# Patient Record
Sex: Female | Born: 1970
Health system: Southern US, Community
[De-identification: ages and names within clinical notes are randomized; demographics above are authoritative.]

## PROBLEM LIST (undated history)

## (undated) DIAGNOSIS — I491 Atrial premature depolarization: Secondary | ICD-10-CM

## (undated) DIAGNOSIS — F419 Anxiety disorder, unspecified: Secondary | ICD-10-CM

## (undated) HISTORY — DX: Anxiety disorder, unspecified: F41.9

## (undated) HISTORY — DX: Atrial premature depolarization: I49.1

## (undated) HISTORY — PX: OTHER SURGICAL HISTORY: SHX169

---

## 1999-05-22 ENCOUNTER — Observation Stay (HOSPITAL_COMMUNITY): Admission: AD | Admit: 1999-05-22 | Discharge: 1999-05-23 | Payer: Self-pay | Admitting: Obstetrics and Gynecology

## 1999-05-24 ENCOUNTER — Encounter: Payer: Self-pay | Admitting: Obstetrics and Gynecology

## 1999-05-24 ENCOUNTER — Inpatient Hospital Stay (HOSPITAL_COMMUNITY): Admission: RE | Admit: 1999-05-24 | Discharge: 1999-05-28 | Payer: Self-pay | Admitting: Obstetrics and Gynecology

## 1999-05-25 ENCOUNTER — Encounter: Payer: Self-pay | Admitting: Obstetrics and Gynecology

## 1999-05-29 ENCOUNTER — Encounter (HOSPITAL_COMMUNITY): Admission: RE | Admit: 1999-05-29 | Discharge: 1999-08-27 | Payer: Self-pay | Admitting: Obstetrics and Gynecology

## 1999-09-02 ENCOUNTER — Encounter (HOSPITAL_COMMUNITY): Admission: RE | Admit: 1999-09-02 | Discharge: 1999-12-01 | Payer: Self-pay | Admitting: Obstetrics and Gynecology

## 1999-12-04 ENCOUNTER — Encounter (HOSPITAL_COMMUNITY): Admission: RE | Admit: 1999-12-04 | Discharge: 2000-03-03 | Payer: Self-pay | Admitting: Obstetrics and Gynecology

## 2000-04-03 ENCOUNTER — Encounter (HOSPITAL_COMMUNITY): Admission: RE | Admit: 2000-04-03 | Discharge: 2000-04-19 | Payer: Self-pay | Admitting: Obstetrics and Gynecology

## 2001-04-25 ENCOUNTER — Other Ambulatory Visit: Admission: RE | Admit: 2001-04-25 | Discharge: 2001-04-25 | Payer: Self-pay | Admitting: Obstetrics and Gynecology

## 2002-10-31 ENCOUNTER — Other Ambulatory Visit: Admission: RE | Admit: 2002-10-31 | Discharge: 2002-10-31 | Payer: Self-pay | Admitting: Obstetrics and Gynecology

## 2004-01-17 ENCOUNTER — Other Ambulatory Visit: Admission: RE | Admit: 2004-01-17 | Discharge: 2004-01-17 | Payer: Self-pay | Admitting: Obstetrics and Gynecology

## 2005-06-01 ENCOUNTER — Other Ambulatory Visit: Admission: RE | Admit: 2005-06-01 | Discharge: 2005-06-01 | Payer: Self-pay | Admitting: Obstetrics and Gynecology

## 2008-02-14 ENCOUNTER — Encounter: Admission: RE | Admit: 2008-02-14 | Discharge: 2008-02-14 | Payer: Self-pay | Admitting: Family Medicine

## 2010-05-27 ENCOUNTER — Encounter: Admission: RE | Admit: 2010-05-27 | Discharge: 2010-05-27 | Payer: Self-pay | Admitting: Family Medicine

## 2015-10-22 ENCOUNTER — Ambulatory Visit
Admission: RE | Admit: 2015-10-22 | Discharge: 2015-10-22 | Disposition: A | Discharge status: Home / Self Care | Payer: BLUE CROSS/BLUE SHIELD | Source: Ambulatory Visit | Attending: Physician Assistant | Admitting: Physician Assistant

## 2015-10-22 ENCOUNTER — Other Ambulatory Visit: Payer: Self-pay | Admitting: Physician Assistant

## 2015-10-22 DIAGNOSIS — R52 Pain, unspecified: Secondary | ICD-10-CM

## 2015-11-26 NOTE — Progress Notes (Signed)
     HPI: 44 year old female for evaluation of palpitations. Patient apparently seen by [facility] 4 years ago for similar symptoms. She had an echocardiogram and monitor with results not available but she feels was normal. She denies dyspnea on exertion, orthopnea, PND, pedal edema, chest pain or syncope. She occasionally has brief flutters but no sustained palpitations.  Current Outpatient Prescriptions  Medication Sig Dispense Refill  . ALPRAZolam (XANAX) 0.25 MG tablet Take 0.25 mg by mouth at bedtime as needed for anxiety.    Marland Kitchen. levonorgestrel (MIRENA) 20 MCG/24HR IUD 1 each by Intrauterine route once.    . meloxicam (MOBIC) 15 MG tablet Take 15 mg by mouth daily.     No current facility-administered medications for this visit.    Allergies  Allergen Reactions  . Neosporin [Neomycin-Bacitracin Zn-Polymyx] Anaphylaxis    Eye drops     Past Medical History  Diagnosis Date  . PAC (premature atrial contraction)   . Anxiety     Past Surgical History  Procedure Laterality Date  . No previous surgery      Social History   Social History  . Marital Status: Married    Spouse Name: N/A  . Number of Children: 2  . Years of Education: N/A   Occupational History  .      Banker   Social History Main Topics  . Smoking status: Never Smoker   . Smokeless tobacco: Not on file  . Alcohol Use: 0.0 oz/week    0 Standard drinks or equivalent per week     Comment: Rare  . Drug Use: Not on file  . Sexual Activity: Not on file   Other Topics Concern  . Not on file   Social History Narrative    Family History  Problem Relation Age of Onset  . Heart attack Maternal Grandfather   . Hyperlipidemia Mother   . Asthma Father     ROS: no fevers or chills, productive cough, hemoptysis, dysphasia, odynophagia, melena, hematochezia, dysuria, hematuria, rash, seizure activity, orthopnea, PND, pedal edema, claudication. Remaining systems are negative.  Physical Exam:   Blood  pressure 112/74, pulse 72, height 5\' 5"  (1.651 m), weight 150 lb (68.04 kg).  General:  Well developed/well nourished in NAD Skin warm/dry Patient not depressed No peripheral clubbing Back-normal HEENT-normal/normal eyelids Neck supple/normal carotid upstroke bilaterally; no bruits; no JVD; no thyromegaly chest - CTA/ normal expansion CV - RRR/normal S1 and S2; no murmurs, rubs or gallops;  PMI nondisplaced Abdomen -NT/ND, no HSM, no mass, + bowel sounds, no bruit 2+ femoral pulses, no bruits Ext-no edema, chords, 2+ DP Neuro-grossly nonfocal  ECG NSR, pacs, no ST changes

## 2015-11-27 ENCOUNTER — Encounter: Payer: Self-pay | Admitting: *Deleted

## 2015-11-27 ENCOUNTER — Encounter: Payer: Self-pay | Admitting: Cardiology

## 2015-11-27 ENCOUNTER — Ambulatory Visit (INDEPENDENT_AMBULATORY_CARE_PROVIDER_SITE_OTHER): Payer: BLUE CROSS/BLUE SHIELD | Admitting: Cardiology

## 2015-11-27 DIAGNOSIS — R002 Palpitations: Secondary | ICD-10-CM | POA: Insufficient documentation

## 2015-11-27 NOTE — Assessment & Plan Note (Signed)
Patient describes occasional brief flutters and had some while in the office. These seem to correlate with PACs or PVCs. I explained that in the setting of normal LV function these are benign. She does not appear to be particular asymptomatic with these. We will consider a beta blocker in the future if they worsen. I will check a TSH, magnesium, potassium and echocardiogram.

## 2015-11-27 NOTE — Patient Instructions (Signed)
Your physician recommends that you HAVE LAB WORK TODAY  Your physician has requested that you have an echocardiogram. Echocardiography is a painless test that uses sound waves to create images of your heart. It provides your doctor with information about the size and shape of your heart and how well your heart's chambers and valves are working. This procedure takes approximately one hour. There are no restrictions for this procedure.   Your physician recommends that you schedule a follow-up appointment in: AS NEEDED

## 2015-11-28 LAB — BASIC METABOLIC PANEL
BUN: 14 mg/dL (ref 7–25)
CO2: 28 mmol/L (ref 20–31)
Calcium: 9.7 mg/dL (ref 8.6–10.2)
Chloride: 103 mmol/L (ref 98–110)
Creat: 0.59 mg/dL (ref 0.50–1.10)
Glucose, Bld: 88 mg/dL (ref 65–99)
Potassium: 4.3 mmol/L (ref 3.5–5.3)
SODIUM: 140 mmol/L (ref 135–146)

## 2015-11-28 LAB — TSH: TSH: 1.786 u[IU]/mL (ref 0.350–4.500)

## 2015-11-28 LAB — MAGNESIUM: Magnesium: 2 mg/dL (ref 1.5–2.5)

## 2015-12-29 ENCOUNTER — Other Ambulatory Visit (HOSPITAL_COMMUNITY): Payer: BLUE CROSS/BLUE SHIELD

## 2015-12-29 ENCOUNTER — Ambulatory Visit (HOSPITAL_COMMUNITY): Payer: BLUE CROSS/BLUE SHIELD | Attending: Cardiology

## 2015-12-29 ENCOUNTER — Other Ambulatory Visit: Payer: Self-pay

## 2015-12-29 DIAGNOSIS — R002 Palpitations: Secondary | ICD-10-CM | POA: Insufficient documentation

## 2015-12-29 DIAGNOSIS — I071 Rheumatic tricuspid insufficiency: Secondary | ICD-10-CM | POA: Insufficient documentation

## 2015-12-29 DIAGNOSIS — I340 Nonrheumatic mitral (valve) insufficiency: Secondary | ICD-10-CM | POA: Insufficient documentation

## 2015-12-30 ENCOUNTER — Other Ambulatory Visit (HOSPITAL_COMMUNITY): Payer: BLUE CROSS/BLUE SHIELD

## 2016-04-23 DIAGNOSIS — J0100 Acute maxillary sinusitis, unspecified: Secondary | ICD-10-CM | POA: Diagnosis not present

## 2016-04-23 DIAGNOSIS — J029 Acute pharyngitis, unspecified: Secondary | ICD-10-CM | POA: Diagnosis not present

## 2016-11-30 DIAGNOSIS — Z01419 Encounter for gynecological examination (general) (routine) without abnormal findings: Secondary | ICD-10-CM | POA: Diagnosis not present

## 2016-11-30 DIAGNOSIS — Z6825 Body mass index (BMI) 25.0-25.9, adult: Secondary | ICD-10-CM | POA: Diagnosis not present

## 2016-12-21 DIAGNOSIS — N951 Menopausal and female climacteric states: Secondary | ICD-10-CM | POA: Diagnosis not present

## 2016-12-21 DIAGNOSIS — Z1231 Encounter for screening mammogram for malignant neoplasm of breast: Secondary | ICD-10-CM | POA: Diagnosis not present

## 2016-12-21 DIAGNOSIS — Z13220 Encounter for screening for lipoid disorders: Secondary | ICD-10-CM | POA: Diagnosis not present

## 2016-12-21 DIAGNOSIS — E559 Vitamin D deficiency, unspecified: Secondary | ICD-10-CM | POA: Diagnosis not present

## 2016-12-21 DIAGNOSIS — Z1329 Encounter for screening for other suspected endocrine disorder: Secondary | ICD-10-CM | POA: Diagnosis not present

## 2016-12-21 DIAGNOSIS — Z131 Encounter for screening for diabetes mellitus: Secondary | ICD-10-CM | POA: Diagnosis not present

## 2017-01-14 DIAGNOSIS — Z23 Encounter for immunization: Secondary | ICD-10-CM | POA: Diagnosis not present

## 2017-02-17 DIAGNOSIS — K219 Gastro-esophageal reflux disease without esophagitis: Secondary | ICD-10-CM | POA: Diagnosis not present

## 2017-05-10 IMAGING — CR DG FOOT COMPLETE 3+V*R*
3 series · 3 of 3 positions shown · non-contrast
Comparison: No prior.

CLINICAL DATA: Pain.  No known injury.

EXAM:
RIGHT FOOT COMPLETE - 3+ VIEW

[view not recorded (1 of 3)]
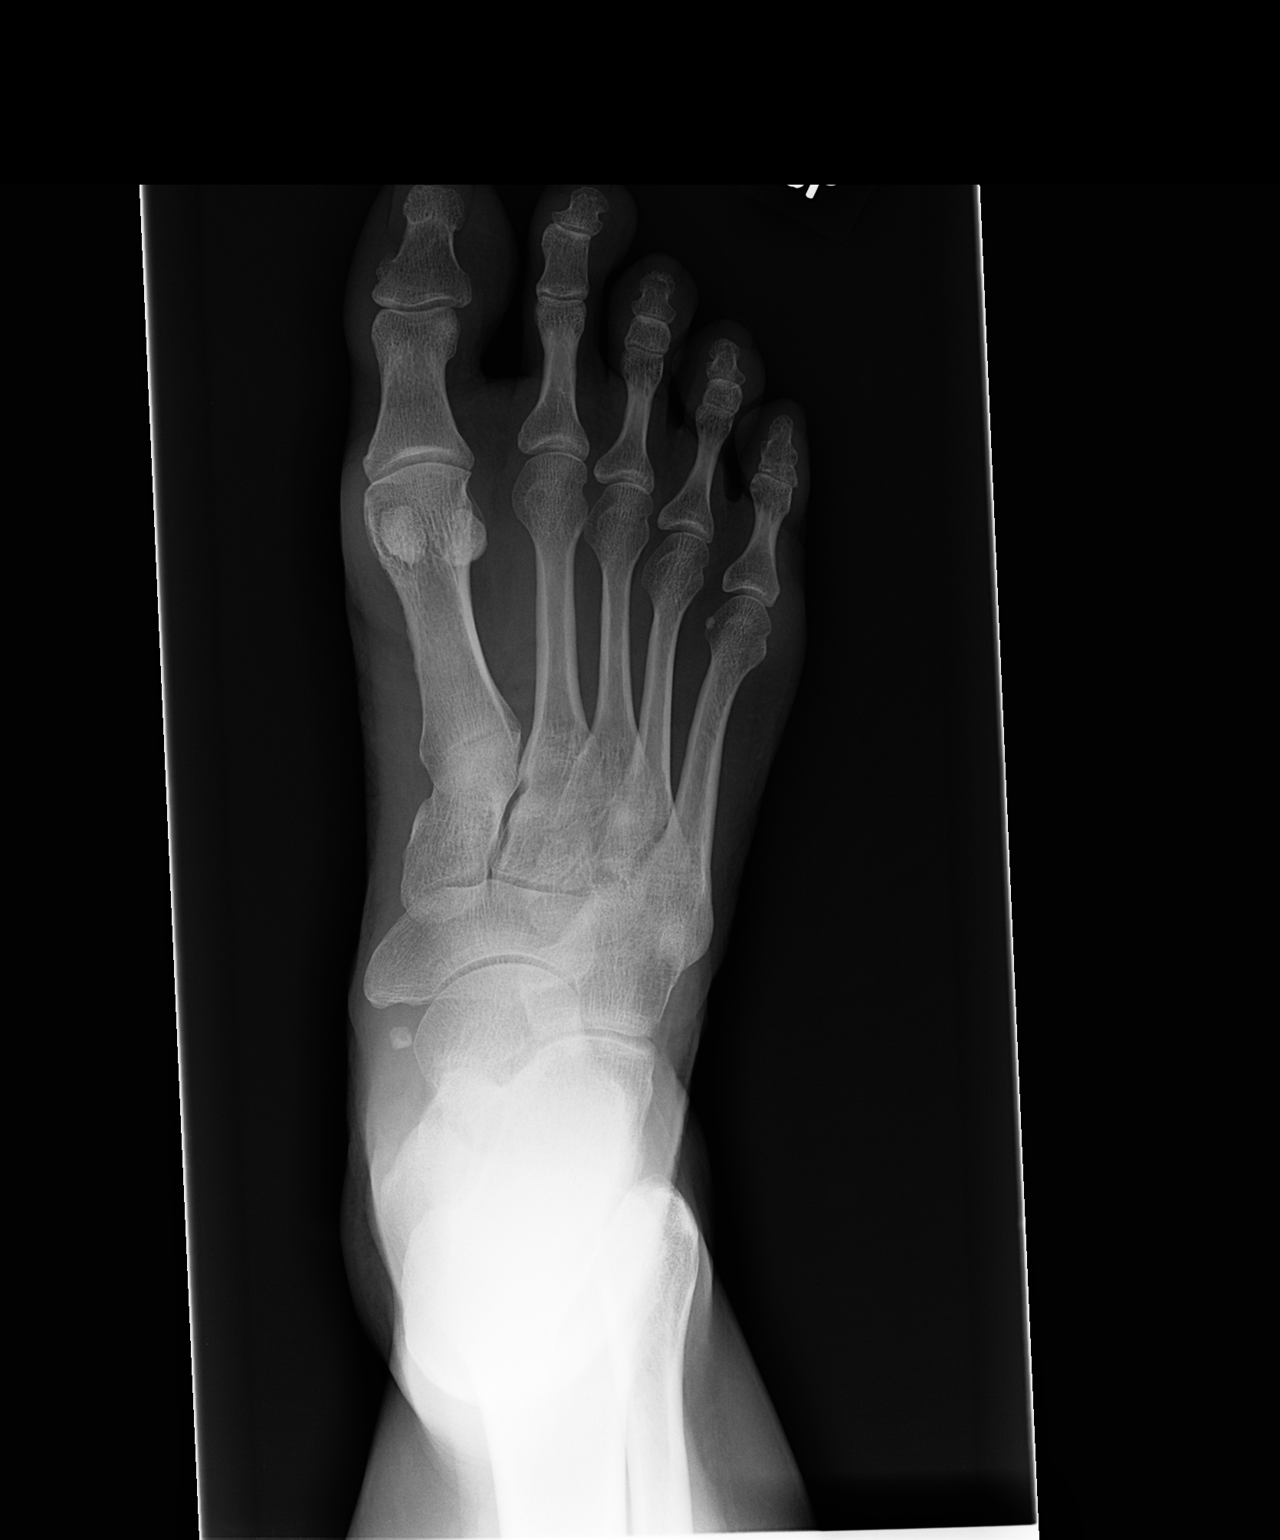

[view not recorded (2 of 3)]
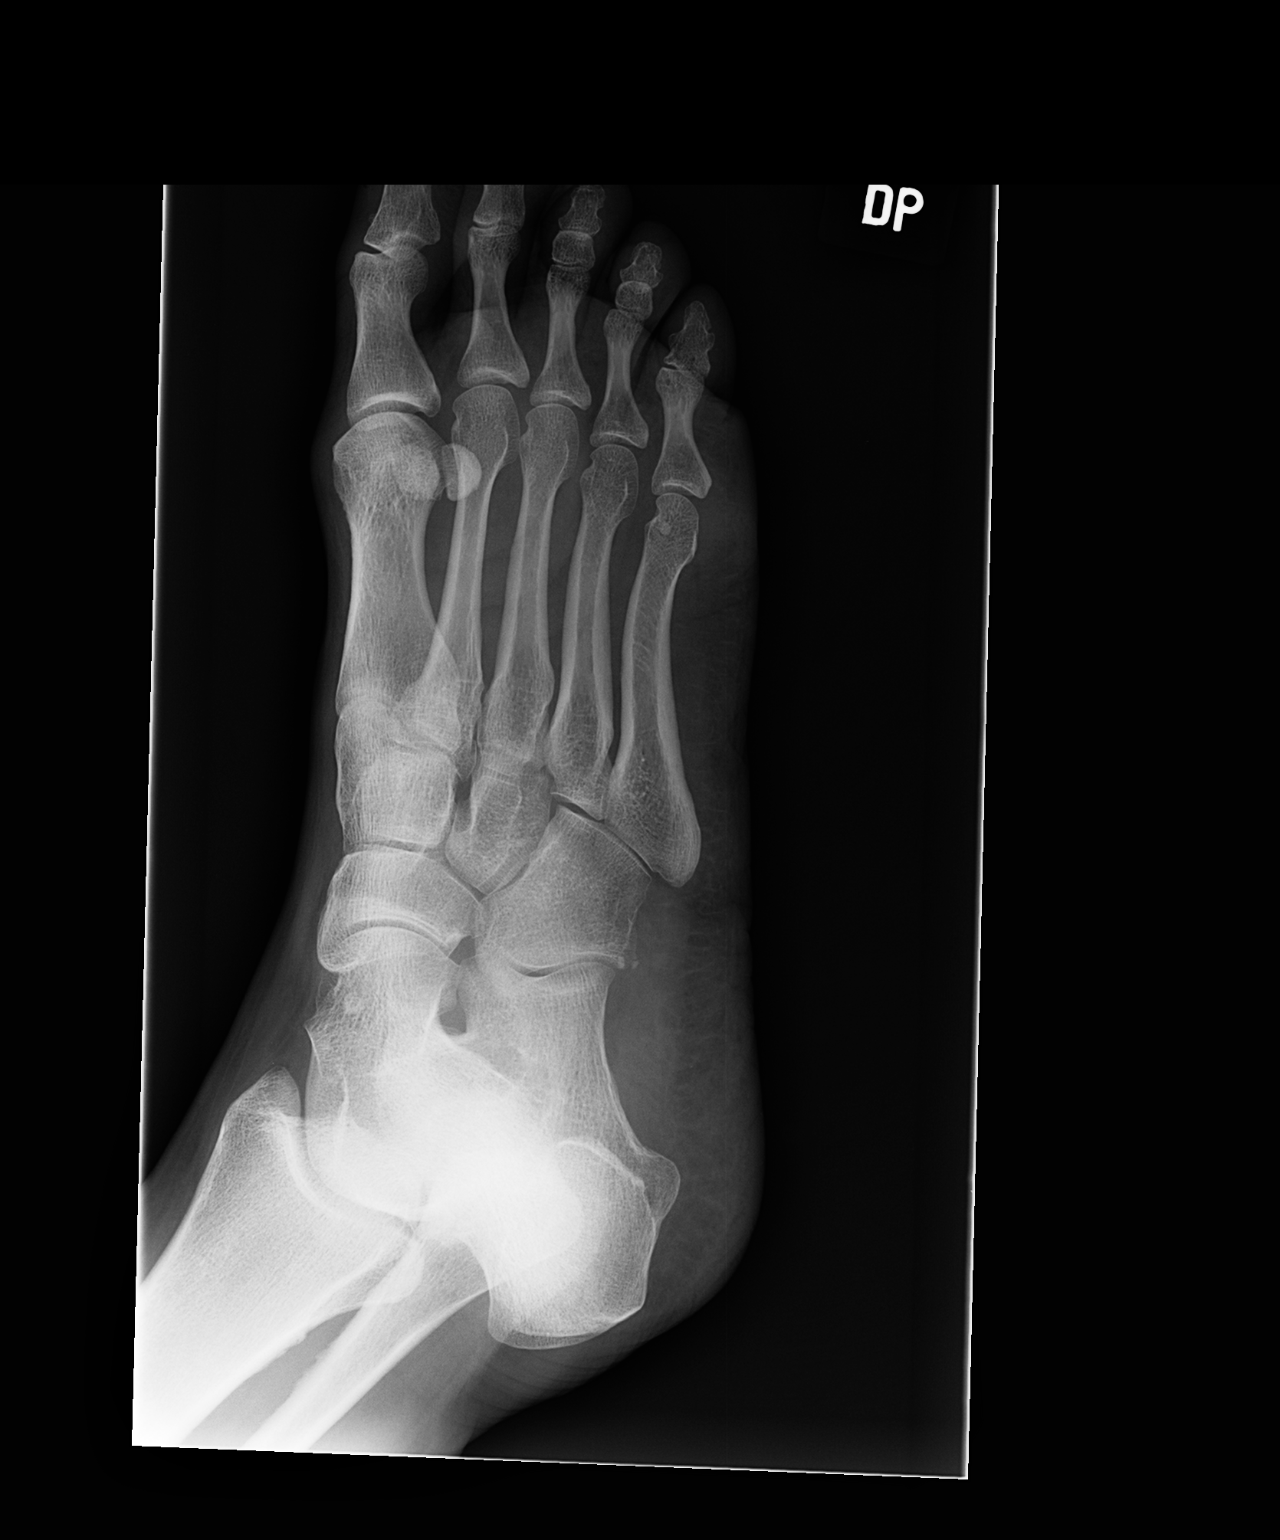

[view not recorded (3 of 3)]
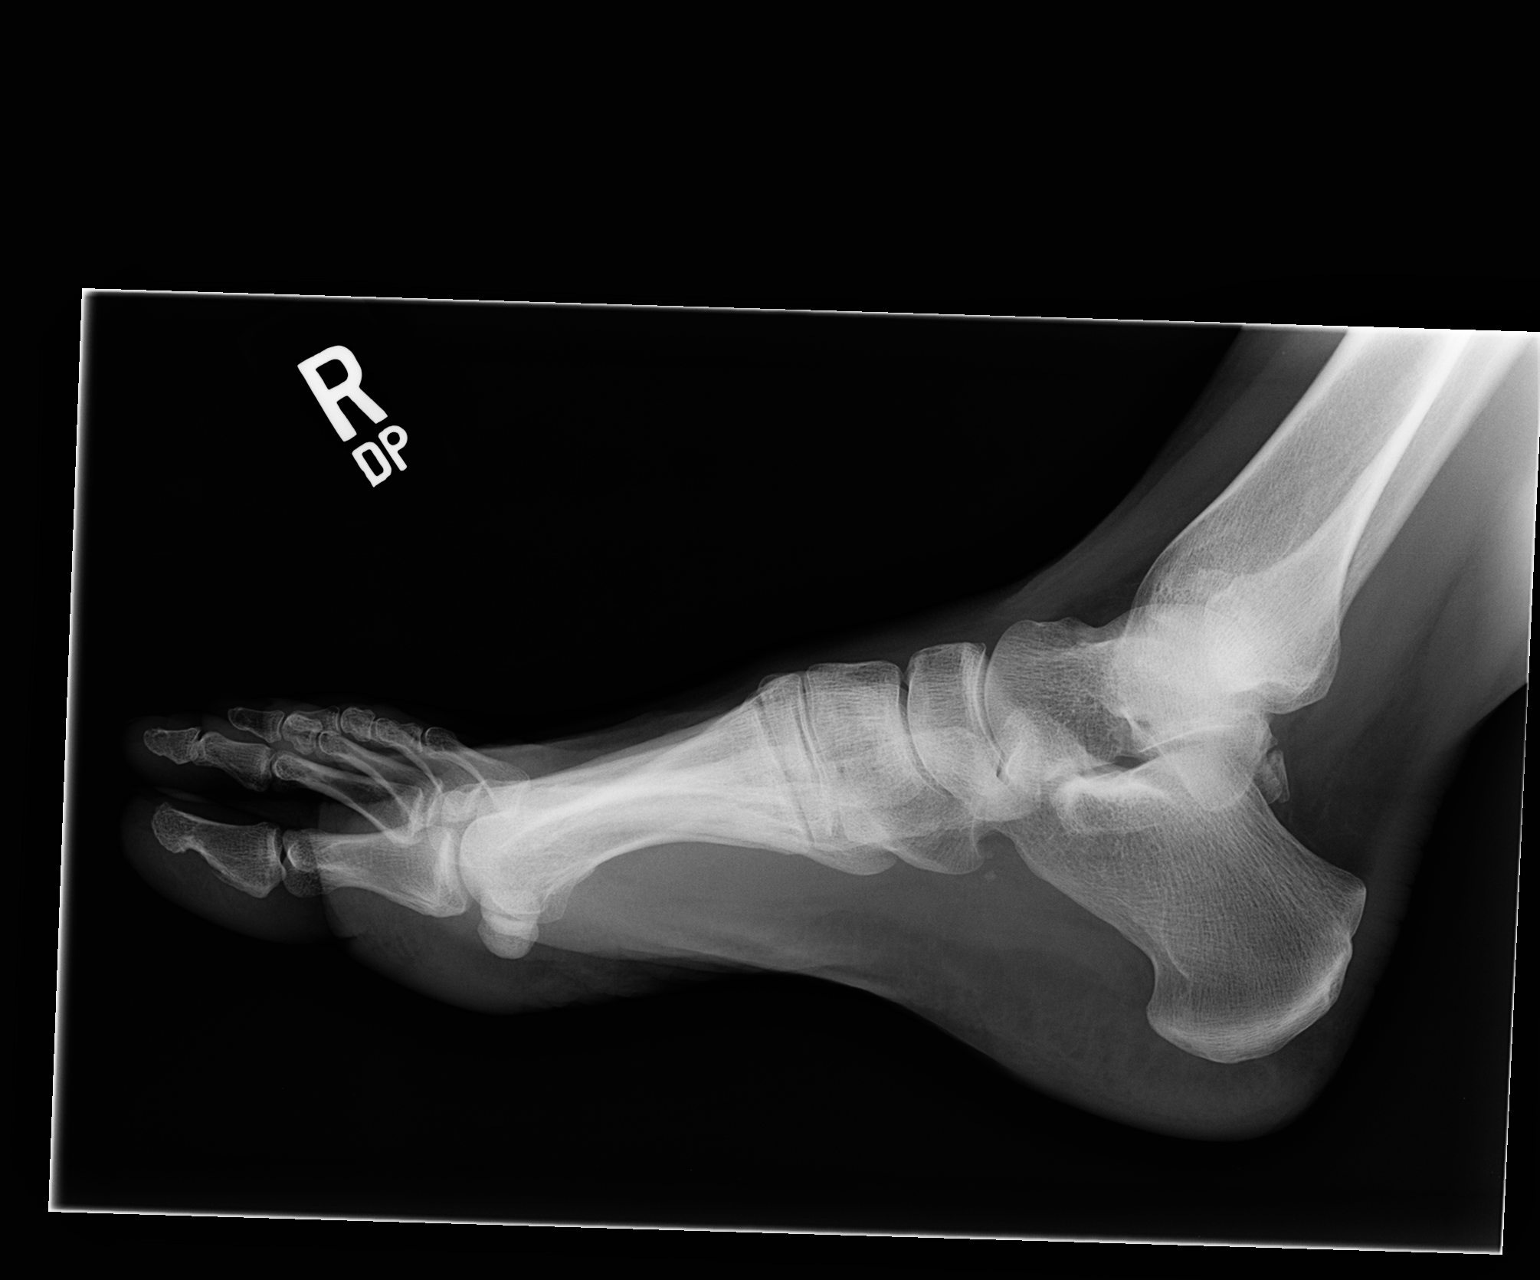

[3 of 3 positions shown; findings below may reference images not displayed]

FINDINGS: Mild first MTP degenerative change. No acute abnormality. Soft
tissues unremarkable P
IMPRESSION: Mild first MTP degenerative change.  No acute abnormality.

## 2017-10-26 DIAGNOSIS — Z23 Encounter for immunization: Secondary | ICD-10-CM | POA: Diagnosis not present

## 2018-06-07 DIAGNOSIS — Z8042 Family history of malignant neoplasm of prostate: Secondary | ICD-10-CM | POA: Diagnosis not present

## 2018-06-07 DIAGNOSIS — Z803 Family history of malignant neoplasm of breast: Secondary | ICD-10-CM | POA: Diagnosis not present

## 2018-06-07 DIAGNOSIS — Z6825 Body mass index (BMI) 25.0-25.9, adult: Secondary | ICD-10-CM | POA: Diagnosis not present

## 2018-06-07 DIAGNOSIS — Z1231 Encounter for screening mammogram for malignant neoplasm of breast: Secondary | ICD-10-CM | POA: Diagnosis not present

## 2018-06-07 DIAGNOSIS — N951 Menopausal and female climacteric states: Secondary | ICD-10-CM | POA: Diagnosis not present

## 2018-06-07 DIAGNOSIS — Z01419 Encounter for gynecological examination (general) (routine) without abnormal findings: Secondary | ICD-10-CM | POA: Diagnosis not present

## 2018-07-20 DIAGNOSIS — Z809 Family history of malignant neoplasm, unspecified: Secondary | ICD-10-CM | POA: Diagnosis not present

## 2018-07-24 ENCOUNTER — Other Ambulatory Visit: Payer: Self-pay | Admitting: Obstetrics and Gynecology

## 2018-07-26 NOTE — Progress Notes (Signed)
HPI: FU palpitations. Echo 1/17 showed vigorous LV systolic function and mild TR. Since last seen 12/16, patient denies dyspnea, chest pain or syncope.  She has occasional palpitations described as a brief Buzz.  No associated presyncope.   Current Outpatient Medications  Medication Sig Dispense Refill  . levonorgestrel (MIRENA) 20 MCG/24HR IUD 1 each by Intrauterine route once.     No current facility-administered medications for this visit.      Past Medical History:  Diagnosis Date  . Anxiety   . PAC (premature atrial contraction)     Past Surgical History:  Procedure Laterality Date  . No previous surgery      Social History   Socioeconomic History  . Marital status: Married    Spouse name: Not on file  . Number of children: 2  . Years of education: Not on file  . Highest education level: Not on file  Occupational History    Comment: Banker  Social Needs  . Financial resource strain: Not on file  . Food insecurity:    Worry: Not on file    Inability: Not on file  . Transportation needs:    Medical: Not on file    Non-medical: Not on file  Tobacco Use  . Smoking status: Never Smoker  . Smokeless tobacco: Never Used  Substance and Sexual Activity  . Alcohol use: Yes    Alcohol/week: 0.0 standard drinks    Comment: Rare  . Drug use: Not on file  . Sexual activity: Not on file  Lifestyle  . Physical activity:    Days per week: Not on file    Minutes per session: Not on file  . Stress: Not on file  Relationships  . Social connections:    Talks on phone: Not on file    Gets together: Not on file    Attends religious service: Not on file    Active member of club or organization: Not on file    Attends meetings of clubs or organizations: Not on file    Relationship status: Not on file  . Intimate partner violence:    Fear of current or ex partner: Not on file    Emotionally abused: Not on file    Physically abused: Not on file    Forced sexual  activity: Not on file  Other Topics Concern  . Not on file  Social History Narrative  . Not on file    Family History  Problem Relation Age of Onset  . Heart attack Maternal Grandfather   . Hyperlipidemia Mother   . Asthma Father     ROS: no fevers or chills, productive cough, hemoptysis, dysphasia, odynophagia, melena, hematochezia, dysuria, hematuria, rash, seizure activity, orthopnea, PND, pedal edema, claudication. Remaining systems are negative.  Physical Exam: Well-developed well-nourished in no acute distress.  Skin is warm and dry.  HEENT is normal.  Neck is supple.  Chest is clear to auscultation with normal expansion.  Cardiovascular exam is regular rate and rhythm.  Abdominal exam nontender or distended. No masses palpated. Extremities show no edema. neuro grossly intact  ECG-sinus rhythm at a rate of 54.  Occasional PACs.  No ST changes.  Personally reviewed  A/P  1 palpitations-patient continues to have occasional palpitations that she tolerates well.  They are described as a brief Buzz and she had some during ECG that showed PACs.  I explained that these are benign in the setting of normal LV function.  We can consider  adding a beta-blocker in the future if symptoms worsen.  There is no relation to caffeine or alcohol use which she rarely consumes.  We will consider repeating her echocardiogram in the future.  Follow-up 1 year.  Olga MillersBrian Amahri Dengel, MD

## 2018-07-27 ENCOUNTER — Other Ambulatory Visit: Payer: Self-pay | Admitting: Obstetrics and Gynecology

## 2018-07-27 DIAGNOSIS — Z803 Family history of malignant neoplasm of breast: Secondary | ICD-10-CM

## 2018-07-28 ENCOUNTER — Encounter: Payer: Self-pay | Admitting: Cardiology

## 2018-07-28 ENCOUNTER — Ambulatory Visit: Payer: BLUE CROSS/BLUE SHIELD | Admitting: Cardiology

## 2018-07-28 VITALS — BP 98/62 | HR 54 | Ht 65.0 in | Wt 154.6 lb

## 2018-07-28 DIAGNOSIS — R002 Palpitations: Secondary | ICD-10-CM

## 2018-07-28 NOTE — Patient Instructions (Signed)
Your physician wants you to follow-up in: ONE YEAR WITH DR CRENSHAW You will receive a reminder letter in the mail two months in advance. If you don't receive a letter, please call our office to schedule the follow-up appointment.   If you need a refill on your cardiac medications before your next appointment, please call your pharmacy.  

## 2018-10-31 DIAGNOSIS — Z23 Encounter for immunization: Secondary | ICD-10-CM | POA: Diagnosis not present

## 2019-03-28 DIAGNOSIS — F419 Anxiety disorder, unspecified: Secondary | ICD-10-CM | POA: Diagnosis not present

## 2019-06-27 ENCOUNTER — Telehealth: Payer: Self-pay | Admitting: *Deleted

## 2019-06-27 NOTE — Telephone Encounter (Signed)
Wanda Carlson, will call back to schedule her follow up visit.

## 2019-07-26 DIAGNOSIS — M25512 Pain in left shoulder: Secondary | ICD-10-CM | POA: Diagnosis not present

## 2019-08-23 DIAGNOSIS — Z23 Encounter for immunization: Secondary | ICD-10-CM | POA: Diagnosis not present

## 2019-08-30 DIAGNOSIS — R509 Fever, unspecified: Secondary | ICD-10-CM | POA: Diagnosis not present

## 2019-08-30 DIAGNOSIS — Z20828 Contact with and (suspected) exposure to other viral communicable diseases: Secondary | ICD-10-CM | POA: Diagnosis not present

## 2019-08-30 DIAGNOSIS — R8290 Unspecified abnormal findings in urine: Secondary | ICD-10-CM | POA: Diagnosis not present

## 2019-08-30 DIAGNOSIS — R05 Cough: Secondary | ICD-10-CM | POA: Diagnosis not present

## 2019-08-30 DIAGNOSIS — Z7189 Other specified counseling: Secondary | ICD-10-CM | POA: Diagnosis not present

## 2019-09-18 DIAGNOSIS — Z20828 Contact with and (suspected) exposure to other viral communicable diseases: Secondary | ICD-10-CM | POA: Diagnosis not present

## 2019-11-11 NOTE — Progress Notes (Signed)
Cardiology Clinic Note   Patient Name: Wanda Carlson Date of Encounter: 11/12/2019  Primary Care Provider:  Milus Height, PA-C Primary Cardiologist:  Olga Millers, MD  Patient Profile    Wanda Carlson 48 year old female presents today for follow-up of her palpitations.  Past Medical History    Past Medical History:  Diagnosis Date  . Anxiety   . PAC (premature atrial contraction)    Past Surgical History:  Procedure Laterality Date  . No previous surgery      Allergies  Allergies  Allergen Reactions  . Neosporin [Neomycin-Bacitracin Zn-Polymyx] Anaphylaxis    Eye drops  . Azithromycin   . Tetrahydrozoline Swelling    neosporin    History of Present Illness    Wanda Carlson has a past medical history of palpitations.  She was last seen by Dr. Jens Som on July 28, 2018.  During that time she described only occasional palpitations and a sensation of a brief Buzz.  She denied dyspnea, presyncope, syncope, and chest pain at that time.  She had some of the buzzing sensation during her office visit during her EKG that showed PACs.  The PACs were described to her as benign beats in the setting of a normal echocardiogram.  A beta blocking agent was also mentioned as a possible option for worsening symptoms in the future.  Her echocardiogram from 12/2015 showed vigorous LV systolic function with mild tricuspid regurgitation.  She presents the clinic today and states she continues to notice brief episodes of palpitations.  She notices palpitations mainly in the morning when she is waking up however, she does notice the palpitations occasionally throughout the day.  She states that with deep breathing and relaxation she is able to slow her heart rate down.  She routinely exercises 2 or more days per week and enjoys running.  She denies chest pain, shortness of breath, lower extremity edema, fatigue, palpitations, melena, hematuria, hemoptysis, diaphoresis, weakness,  presyncope, syncope, orthopnea, and PND.   Home Medications    Prior to Admission medications   Medication Sig Start Date End Date Taking? Authorizing Provider  levonorgestrel (MIRENA) 20 MCG/24HR IUD 1 each by Intrauterine route once.    [provider]    Family History    Family History  Problem Relation Age of Onset  . Heart attack Maternal Grandfather   . Hyperlipidemia Mother   . Asthma Father    She indicated that her mother is deceased. She indicated that her father is deceased. She indicated that her brother is alive. She indicated that her maternal grandfather is deceased.  Social History    Social History   Socioeconomic History  . Marital status: Married    Spouse name: Not on file  . Number of children: 2  . Years of education: Not on file  . Highest education level: Not on file  Occupational History    Comment: Banker  Social Needs  . Financial resource strain: Not on file  . Food insecurity    Worry: Not on file    Inability: Not on file  . Transportation needs    Medical: Not on file    Non-medical: Not on file  Tobacco Use  . Smoking status: Never Smoker  . Smokeless tobacco: Never Used  Substance and Sexual Activity  . Alcohol use: Yes    Alcohol/week: 0.0 standard drinks    Comment: Rare  . Drug use: Not on file  . Sexual activity: Not on file  Lifestyle  . Physical activity  Days per week: Not on file    Minutes per session: Not on file  . Stress: Not on file  Relationships  . Social Herbalist on phone: Not on file    Gets together: Not on file    Attends religious service: Not on file    Active member of club or organization: Not on file    Attends meetings of clubs or organizations: Not on file    Relationship status: Not on file  . Intimate partner violence    Fear of current or ex partner: Not on file    Emotionally abused: Not on file    Physically abused: Not on file    Forced sexual activity: Not on  file  Other Topics Concern  . Not on file  Social History Narrative  . Not on file     Review of Systems    General:  No chills, fever, night sweats or weight changes.  Cardiovascular:  No chest pain, dyspnea on exertion, edema, orthopnea, palpitations, paroxysmal nocturnal dyspnea. Dermatological: No rash, lesions/masses Respiratory: No cough, dyspnea Urologic: No hematuria, dysuria Abdominal:   No nausea, vomiting, diarrhea, bright red blood per rectum, melena, or hematemesis Neurologic:  No visual changes, wkns, changes in mental status. All other systems reviewed and are otherwise negative except as noted above.  Physical Exam    VS:  BP 121/75   Pulse 66   Ht 5' 5.5" (1.664 m)   Wt 150 lb 6.4 oz (68.2 kg)   SpO2 100%   BMI 24.65 kg/m  , BMI Body mass index is 24.65 kg/m. GEN: Well nourished, well developed, in no acute distress. HEENT: normal. Neck: Supple, no JVD, carotid bruits, or masses. Cardiac: RRR, no murmurs, rubs, or gallops. No clubbing, cyanosis, edema.  Radials/DP/PT 2+ and equal bilaterally.  Respiratory:  Respirations regular and unlabored, clear to auscultation bilaterally. GI: Soft, nontender, nondistended, BS + x 4. MS: no deformity or atrophy. Skin: warm and dry, no rash. Neuro:  Strength and sensation are intact. Psych: Normal affect.  Accessory Clinical Findings    ECG personally reviewed by me today-sinus rhythm with premature atrial complexes 66 bpm- No acute changes  EKG 07/2018 Sinus bradycardia with premature atrial complexes 54 bpm  Echocardiogram 12/29/2015 Study Conclusions  - Left ventricle: The cavity size was normal. Systolic function was   vigorous. The estimated ejection fraction was in the range of 65%   to 70%. Wall motion was normal; there were no regional wall   motion abnormalities. Left ventricular diastolic function   parameters were normal. There was no evidence of elevated   ventricular filling pressure by Doppler  parameters. - Aortic valve: Trileaflet; normal thickness leaflets. There was no   regurgitation. - Aortic root: The aortic root was normal in size. - Mitral valve: There was trivial regurgitation. - Left atrium: The atrium was normal in size. - Right ventricle: Systolic function was normal. - Right atrium: The atrium was normal in size. - Tricuspid valve: There was mild regurgitation. - Pulmonic valve: There was no regurgitation. - Pulmonary arteries: Systolic pressure was within the normal   range. - Inferior vena cava: The vessel was normal in size. - Pericardium, extracardiac: There was no pericardial effusion.    Assessment & Plan   1.  Palpitations-she continues to have  brief and occasional episodes of palpitations which she continues to tolerate well.  EKG today shows sinus rhythm with premature atrial complexes 66 bpm. Again discuss the  possibility of a mild beta blocking agent if symptoms become worse in the future and the possibility of a cardiac monitor. Provided reassurance/education about PACs. Repeat echocardiogram  was  discussed during previous visit, will defer for now due to no increased fatigue, shortness of breath, or EKG changes.  Anxiety-has noticed increased anxiety related to COVID-19 pandemic. Discussed today impact stress/anxiety can have on cardiovascular system. Provided mindfulness relaxation techniques handout. Followed by PCP.    Disposition: Follow-up with Dr. Jens Somrenshaw in 1 year.  Thomasene RippleJesse M. Neno Hohensee NP-C      [facility] [address] Office [phone] Fax [phone]

## 2019-11-12 ENCOUNTER — Ambulatory Visit (INDEPENDENT_AMBULATORY_CARE_PROVIDER_SITE_OTHER): Payer: BC Managed Care – PPO | Admitting: General Practice

## 2019-11-12 ENCOUNTER — Other Ambulatory Visit: Payer: Self-pay

## 2019-11-12 ENCOUNTER — Encounter: Payer: Self-pay | Admitting: General Practice

## 2019-11-12 VITALS — BP 121/75 | HR 66 | Ht 65.5 in | Wt 150.4 lb

## 2019-11-12 DIAGNOSIS — F419 Anxiety disorder, unspecified: Secondary | ICD-10-CM | POA: Diagnosis not present

## 2019-11-12 DIAGNOSIS — R002 Palpitations: Secondary | ICD-10-CM | POA: Diagnosis not present

## 2019-11-12 NOTE — Patient Instructions (Addendum)
INCREASE EXERCISE 30 MINUTES/DAY 5 DAYS A WEEK  Follow-Up: IN 12 months Please call our office 2 months in advance, SEPT 2021 to schedule this NOV 2021 appointment. In Person You may see DR Loa Socks or one of the following Advanced Practice Providers on your designated Care Team:  Kerin Ransom, PA-C  [place], [place]  Coletta Memos, [place]  Medication Instructions:  The current medical regimen is effective;  continue present plan and medications as directed. Please refer to the Current Medication list given to you today. If you need a refill on your cardiac medications before your next appointment, please call your pharmacy.  At [facility], you and your health needs are our priority.  As part of our continuing mission to provide you with exceptional heart care, we have created designated Provider Care Teams.  These Care Teams include your primary Cardiologist (physician) and Advanced Practice Providers (APPs -  Physician Assistants and Nurse Practitioners) who all work together to provide you with the care you need, when you need it.  Thank you for choosing [facility] at [facility]!!      Mindfulness-Based Stress Reduction Mindfulness-based stress reduction (MBSR) is a program that helps people learn to practice mindfulness. Mindfulness is the practice of intentionally paying attention to the present moment. It can be learned and practiced through techniques such as education, breathing exercises, meditation, and yoga. MBSR includes several mindfulness techniques in one program. MBSR works best when you understand the treatment, are willing to try new things, and can commit to spending time practicing what you learn. MBSR training may include learning about:  How your emotions, thoughts, and reactions affect your body.  New ways to respond to things that cause negative thoughts to start (triggers).  How to notice your thoughts and let go of them.  Practicing awareness  of everyday things that you normally do without thinking.  The techniques and goals of different types of meditation. What are the benefits of MBSR? MBSR can have many benefits, which include helping you to:  Develop self-awareness. This refers to knowing and understanding yourself.  Learn skills and attitudes that help you to participate in your own health care.  Learn new ways to care for yourself.  Be more accepting about how things are, and let things go.  Be less judgmental and approach things with an open mind.  Be patient with yourself and trust yourself more. MBSR has also been shown to:  Reduce negative emotions, such as depression and anxiety.  Improve memory and focus.  Change how you sense and approach pain.  Boost your body's ability to fight infections.  Help you connect better with other people.  Improve your sense of well-being. Follow these instructions at home:   Find a local in-person or online MBSR program.  Set aside some time regularly for mindfulness practice.  Find a mindfulness practice that works best for you. This may include one or more of the following: ? Meditation. Meditation involves focusing your mind on a certain thought or activity. ? Breathing awareness exercises. These help you to stay present by focusing on your breath. ? Body scan. For this practice, you lie down and pay attention to each part of your body from head to toe. You can identify tension and soreness and intentionally relax parts of your body. ? Yoga. Yoga involves stretching and breathing, and it can improve your ability to move and be flexible. It can also provide an experience of testing your body's limits,  which can help you release stress. ? Mindful eating. This way of eating involves focusing on the taste, texture, color, and smell of each bite of food. Because this slows down eating and helps you feel full sooner, it can be an important part of a weight-loss  plan.  Find a podcast or recording that provides guidance for breathing awareness, body scan, or meditation exercises. You can listen to these any time when you have a free moment to rest without distractions.  Follow your treatment plan as told by your health care provider. This may include taking regular medicines and making changes to your diet or lifestyle as recommended. How to practice mindfulness To do a basic awareness exercise:  Find a comfortable place to sit.  Pay attention to the present moment. Observe your thoughts, feelings, and surroundings just as they are.  Avoid placing judgment on yourself, your feelings, or your surroundings. Make note of any judgment that comes up, and let it go.  Your mind may wander, and that is okay. Make note of when your thoughts drift, and return your attention to the present moment. To do basic mindfulness meditation:  Find a comfortable place to sit. This may include a stable chair or a firm floor cushion. ? Sit upright with your back straight. Let your arms fall next to your side with your hands resting on your legs. ? If sitting in a chair, rest your feet flat on the floor. ? If sitting on a cushion, cross your legs in front of you.  Keep your head in a neutral position with your chin dropped slightly. Relax your jaw and rest the tip of your tongue on the roof of your mouth. Drop your gaze to the floor. You can close your eyes if you like.  Breathe normally and pay attention to your breath. Feel the air moving in and out of your nose. Feel your belly expanding and relaxing with each breath.  Your mind may wander, and that is okay. Make note of when your thoughts drift, and return your attention to your breath.  Avoid placing judgment on yourself, your feelings, or your surroundings. Make note of any judgment or feelings that come up, let them go, and bring your attention back to your breath.  When you are ready, lift your gaze or open  your eyes. Pay attention to how your body feels after the meditation. Where to find more information You can find more information about MBSR from:  Your health care provider.  Community-based meditation centers or programs.  Programs offered near you. Summary  Mindfulness-based stress reduction (MBSR) is a program that teaches you how to intentionally pay attention to the present moment. It is used with other treatments to help you cope better with daily stress, emotions, and pain.  MBSR focuses on developing self-awareness, which allows you to respond to life stress without judgment or negative emotions.  MBSR programs may involve learning different mindfulness practices, such as breathing exercises, meditation, yoga, body scan, or mindful eating. Find a mindfulness practice that works best for you, and set aside time for it on a regular basis. This information is not intended to replace advice given to you by your health care provider. Make sure you discuss any questions you have with your health care provider. Document Released: 04/14/2017 Document Revised: 11/18/2017 Document Reviewed: 04/14/2017 Elsevier Patient Education  2020 [employer].

## 2020-01-14 DIAGNOSIS — M25571 Pain in right ankle and joints of right foot: Secondary | ICD-10-CM | POA: Diagnosis not present

## 2020-03-17 DIAGNOSIS — R300 Dysuria: Secondary | ICD-10-CM | POA: Diagnosis not present

## 2020-06-04 DIAGNOSIS — J0100 Acute maxillary sinusitis, unspecified: Secondary | ICD-10-CM | POA: Diagnosis not present

## 2020-06-28 DIAGNOSIS — N3000 Acute cystitis without hematuria: Secondary | ICD-10-CM | POA: Diagnosis not present

## 2020-10-31 DIAGNOSIS — Z20822 Contact with and (suspected) exposure to covid-19: Secondary | ICD-10-CM | POA: Diagnosis not present

## 2020-11-12 DIAGNOSIS — Z20822 Contact with and (suspected) exposure to covid-19: Secondary | ICD-10-CM | POA: Diagnosis not present

## 2021-01-13 DIAGNOSIS — Z13220 Encounter for screening for lipoid disorders: Secondary | ICD-10-CM | POA: Diagnosis not present

## 2021-01-13 DIAGNOSIS — Z01419 Encounter for gynecological examination (general) (routine) without abnormal findings: Secondary | ICD-10-CM | POA: Diagnosis not present

## 2021-01-13 DIAGNOSIS — Z1329 Encounter for screening for other suspected endocrine disorder: Secondary | ICD-10-CM | POA: Diagnosis not present

## 2021-01-13 DIAGNOSIS — Z13228 Encounter for screening for other metabolic disorders: Secondary | ICD-10-CM | POA: Diagnosis not present

## 2021-01-13 DIAGNOSIS — Z6825 Body mass index (BMI) 25.0-25.9, adult: Secondary | ICD-10-CM | POA: Diagnosis not present

## 2021-03-17 NOTE — Progress Notes (Deleted)
      HPI: FU palpitations. Echo 1/17 showed vigorous LV systolic function and mild TR. Since last seen   Current Outpatient Medications  Medication Sig Dispense Refill  . levonorgestrel (MIRENA) 20 MCG/24HR IUD 1 each by Intrauterine route once.     No current facility-administered medications for this visit.     Past Medical History:  Diagnosis Date  . Anxiety   . PAC (premature atrial contraction)     Past Surgical History:  Procedure Laterality Date  . No previous surgery      Social History   Socioeconomic History  . Marital status: Married    Spouse name: Not on file  . Number of children: 2  . Years of education: Not on file  . Highest education level: Not on file  Occupational History    Comment: Banker  Tobacco Use  . Smoking status: Never Smoker  . Smokeless tobacco: Never Used  Substance and Sexual Activity  . Alcohol use: Yes    Alcohol/week: 0.0 standard drinks    Comment: Rare  . Drug use: Not on file  . Sexual activity: Not on file  Other Topics Concern  . Not on file  Social History Narrative  . Not on file   Social Determinants of Health   Financial Resource Strain: Not on file  Food Insecurity: Not on file  Transportation Needs: Not on file  Physical Activity: Not on file  Stress: Not on file  Social Connections: Not on file  Intimate Partner Violence: Not on file    Family History  Problem Relation Age of Onset  . Heart attack Maternal Grandfather   . Hyperlipidemia Mother   . Asthma Father     ROS: no fevers or chills, productive cough, hemoptysis, dysphasia, odynophagia, melena, hematochezia, dysuria, hematuria, rash, seizure activity, orthopnea, PND, pedal edema, claudication. Remaining systems are negative.  Physical Exam: Well-developed well-nourished in no acute distress.  Skin is warm and dry.  HEENT is normal.  Neck is supple.  Chest is clear to auscultation with normal expansion.  Cardiovascular exam is regular  rate and rhythm.  Abdominal exam nontender or distended. No masses palpated. Extremities show no edema. neuro grossly intact  ECG- personally reviewed  A/P  1 palpitations-symptoms are essentially unchanged and felt to be consistent with PACs.  We can consider addition of beta-blockade if her symptoms worsen in the future.  Olga Millers, MD

## 2021-03-18 NOTE — Progress Notes (Signed)
      HPI: FU palpitations. Echo 1/17 showed vigorous LV systolic function and mild TR. Since last seen there is no dyspnea, chest pain or syncope.  She continues to have occasional palpitations unchanged.  Predominantly occurs during stressful times.  Current Outpatient Medications  Medication Sig Dispense Refill  . levonorgestrel (MIRENA) 20 MCG/24HR IUD 1 each by Intrauterine route once.     No current facility-administered medications for this visit.     Past Medical History:  Diagnosis Date  . Anxiety   . PAC (premature atrial contraction)     Past Surgical History:  Procedure Laterality Date  . No previous surgery      Social History   Socioeconomic History  . Marital status: Married    Spouse name: Not on file  . Number of children: 2  . Years of education: Not on file  . Highest education level: Not on file  Occupational History    Comment: Banker  Tobacco Use  . Smoking status: Never Smoker  . Smokeless tobacco: Never Used  Substance and Sexual Activity  . Alcohol use: Yes    Alcohol/week: 0.0 standard drinks    Comment: Rare  . Drug use: Not on file  . Sexual activity: Not on file  Other Topics Concern  . Not on file  Social History Narrative  . Not on file   Social Determinants of Health   Financial Resource Strain: Not on file  Food Insecurity: Not on file  Transportation Needs: Not on file  Physical Activity: Not on file  Stress: Not on file  Social Connections: Not on file  Intimate Partner Violence: Not on file    Family History  Problem Relation Age of Onset  . Heart attack Maternal Grandfather   . Hyperlipidemia Mother   . Asthma Father     ROS: no fevers or chills, productive cough, hemoptysis, dysphasia, odynophagia, melena, hematochezia, dysuria, hematuria, rash, seizure activity, orthopnea, PND, pedal edema, claudication. Remaining systems are negative.  Physical Exam: Well-developed well-nourished in no acute distress.  Skin  is warm and dry.  HEENT is normal.  Neck is supple.  Chest is clear to auscultation with normal expansion.  Cardiovascular exam is regular rate and rhythm.  Abdominal exam nontender or distended. No masses palpated. Extremities show no edema. neuro grossly intact  ECG-sinus rhythm with occasional PAC, no ST changes.  Personally reviewed  A/P  1 palpitations-symptoms are essentially unchanged and felt to be consistent with PACs.  We can consider addition of beta-blockade if her symptoms worsen in the future.  Olga Millers, MD

## 2021-03-23 ENCOUNTER — Ambulatory Visit: Payer: BC Managed Care – PPO | Admitting: Cardiology

## 2021-03-26 ENCOUNTER — Other Ambulatory Visit: Payer: Self-pay

## 2021-03-26 ENCOUNTER — Ambulatory Visit (INDEPENDENT_AMBULATORY_CARE_PROVIDER_SITE_OTHER): Payer: BC Managed Care – PPO | Admitting: Cardiology

## 2021-03-26 ENCOUNTER — Encounter: Payer: Self-pay | Admitting: Cardiology

## 2021-03-26 VITALS — BP 104/60 | HR 60 | Ht 65.0 in | Wt 155.8 lb

## 2021-03-26 DIAGNOSIS — R002 Palpitations: Secondary | ICD-10-CM | POA: Diagnosis not present

## 2021-03-26 NOTE — Patient Instructions (Signed)

## 2021-08-10 DIAGNOSIS — Z20822 Contact with and (suspected) exposure to covid-19: Secondary | ICD-10-CM | POA: Diagnosis not present

## 2021-08-25 DIAGNOSIS — Z1231 Encounter for screening mammogram for malignant neoplasm of breast: Secondary | ICD-10-CM | POA: Diagnosis not present

## 2021-08-25 DIAGNOSIS — Z13820 Encounter for screening for osteoporosis: Secondary | ICD-10-CM | POA: Diagnosis not present

## 2021-09-14 DIAGNOSIS — M8580 Other specified disorders of bone density and structure, unspecified site: Secondary | ICD-10-CM | POA: Diagnosis not present

## 2021-11-04 DIAGNOSIS — K648 Other hemorrhoids: Secondary | ICD-10-CM | POA: Diagnosis not present

## 2021-11-04 DIAGNOSIS — D123 Benign neoplasm of transverse colon: Secondary | ICD-10-CM | POA: Diagnosis not present

## 2021-11-04 DIAGNOSIS — Z1211 Encounter for screening for malignant neoplasm of colon: Secondary | ICD-10-CM | POA: Diagnosis not present

## 2021-11-04 DIAGNOSIS — K5730 Diverticulosis of large intestine without perforation or abscess without bleeding: Secondary | ICD-10-CM | POA: Diagnosis not present

## 2022-06-13 ENCOUNTER — Telehealth: Payer: BC Managed Care – PPO | Admitting: Family

## 2022-06-13 DIAGNOSIS — B9789 Other viral agents as the cause of diseases classified elsewhere: Secondary | ICD-10-CM

## 2022-06-13 DIAGNOSIS — J329 Chronic sinusitis, unspecified: Secondary | ICD-10-CM

## 2022-06-13 MED ORDER — FLUTICASONE PROPIONATE 50 MCG/ACT NA SUSP: 2.0000 | Freq: Every day | NASAL | 6 refills | Status: AC

## 2022-06-13 NOTE — Progress Notes (Signed)
E-Visit for Sinus Problems ? ?We are sorry that you are not feeling well.  Here is how we plan to help! ? ?Based on what you have shared with me it looks like you have sinusitis.  Sinusitis is inflammation and infection in the sinus cavities of the head.  Based on your presentation I believe you most likely have Acute Viral Sinusitis.This is an infection most likely caused by a virus. There is not specific treatment for viral sinusitis other than to help you with the symptoms until the infection runs its course.  You may use an oral decongestant such as Mucinex D or if you have glaucoma or high blood pressure use plain Mucinex. Saline nasal spray help and can safely be used as often as needed for congestion, I have prescribed: Fluticasone nasal spray two sprays in each nostril once a day. ? ? ?Some authorities believe that zinc sprays or the use of Echinacea may shorten the course of your symptoms. ? ?Sinus infections are not as easily transmitted as other respiratory infection, however we still recommend that you avoid close contact with loved ones, especially the very young and elderly.  Remember to wash your hands thoroughly throughout the day as this is the number one way to prevent the spread of infection! ? ?Home Care: ?Only take medications as instructed by your medical team. ?Do not take these medications with alcohol. ?A steam or ultrasonic humidifier can help congestion.  You can place a towel over your head and breathe in the steam from hot water coming from a faucet. ?Avoid close contacts especially the very young and the elderly. ?Cover your mouth when you cough or sneeze. ?Always remember to wash your hands. ? ?Get Help Right Away If: ?You develop worsening fever or sinus pain. ?You develop a severe head ache or visual changes. ?Your symptoms persist after you have completed your treatment plan. ? ?Make sure you ?Understand these instructions. ?Will watch your condition. ?Will get help right away if  you are not doing well or get worse. ? ? ?Thank you for choosing an e-visit. ? ?Your e-visit answers were reviewed by a board certified advanced clinical practitioner to complete your personal care plan. Depending upon the condition, your plan could have included both over the counter or prescription medications. ? ?Please review your pharmacy choice. Make sure the pharmacy is open so you can pick up prescription now. If there is a problem, you may contact your provider through [employer] and have the prescription routed to another pharmacy.  Your safety is important to [place]. If you have drug allergies check your prescription carefully.  ? ?For the next 24 hours you can use MyChart to ask questions about today's visit, request a non-urgent call back, or ask for a work or school excuse. ?You will get an email in the next two days asking about your experience. I hope that your e-visit has been valuable and will speed your recovery. ? ?Approximately 5 minutes was spent documenting and reviewing patient's chart.  ? ?

## 2022-09-06 DIAGNOSIS — J0140 Acute pansinusitis, unspecified: Secondary | ICD-10-CM | POA: Diagnosis not present

## 2022-09-06 DIAGNOSIS — Z6825 Body mass index (BMI) 25.0-25.9, adult: Secondary | ICD-10-CM | POA: Diagnosis not present

## 2023-03-09 DIAGNOSIS — Z13220 Encounter for screening for lipoid disorders: Secondary | ICD-10-CM | POA: Diagnosis not present

## 2023-03-09 DIAGNOSIS — Z01419 Encounter for gynecological examination (general) (routine) without abnormal findings: Secondary | ICD-10-CM | POA: Diagnosis not present

## 2023-03-09 DIAGNOSIS — Z1329 Encounter for screening for other suspected endocrine disorder: Secondary | ICD-10-CM | POA: Diagnosis not present

## 2023-03-09 DIAGNOSIS — Z131 Encounter for screening for diabetes mellitus: Secondary | ICD-10-CM | POA: Diagnosis not present

## 2023-03-09 DIAGNOSIS — Z124 Encounter for screening for malignant neoplasm of cervix: Secondary | ICD-10-CM | POA: Diagnosis not present

## 2023-03-09 DIAGNOSIS — Z1321 Encounter for screening for nutritional disorder: Secondary | ICD-10-CM | POA: Diagnosis not present

## 2023-03-09 DIAGNOSIS — Z1151 Encounter for screening for human papillomavirus (HPV): Secondary | ICD-10-CM | POA: Diagnosis not present

## 2023-03-09 DIAGNOSIS — Z13228 Encounter for screening for other metabolic disorders: Secondary | ICD-10-CM | POA: Diagnosis not present

## 2023-03-09 DIAGNOSIS — Z6826 Body mass index (BMI) 26.0-26.9, adult: Secondary | ICD-10-CM | POA: Diagnosis not present

## 2023-03-09 DIAGNOSIS — Z1231 Encounter for screening mammogram for malignant neoplasm of breast: Secondary | ICD-10-CM | POA: Diagnosis not present

## 2023-04-07 DIAGNOSIS — B079 Viral wart, unspecified: Secondary | ICD-10-CM | POA: Diagnosis not present

## 2023-05-04 DIAGNOSIS — B079 Viral wart, unspecified: Secondary | ICD-10-CM | POA: Diagnosis not present

## 2023-05-15 DIAGNOSIS — N390 Urinary tract infection, site not specified: Secondary | ICD-10-CM | POA: Diagnosis not present

## 2023-08-27 ENCOUNTER — Encounter: Payer: Self-pay | Admitting: *Deleted

## 2023-08-27 ENCOUNTER — Other Ambulatory Visit: Payer: Self-pay

## 2023-08-27 ENCOUNTER — Ambulatory Visit
Admission: EM | Admit: 2023-08-27 | Discharge: 2023-08-27 | Disposition: A | Discharge status: Home / Self Care | Payer: BC Managed Care – PPO | Attending: Internal Medicine | Admitting: Internal Medicine

## 2023-08-27 DIAGNOSIS — R1013 Epigastric pain: Secondary | ICD-10-CM

## 2023-08-27 MED ORDER — SUCRALFATE 1 G PO TABS: 1.0000 g | ORAL_TABLET | Freq: Two times a day (BID) | ORAL | 0 refills | Status: AC

## 2023-08-27 MED ORDER — ALUMINUM & MAGNESIUM HYDROXIDE 200-200 MG/5ML PO SUSP
30.0000 mL | Freq: Once | ORAL | Status: AC
  Administered 2023-08-27: 30 mL via ORAL

## 2023-08-27 MED ORDER — LIDOCAINE VISCOUS HCL 2 % MT SOLN
15.0000 mL | Freq: Once | OROMUCOSAL | Status: AC
  Administered 2023-08-27: 15 mL via OROMUCOSAL

## 2023-08-27 NOTE — Discharge Instructions (Signed)
I have prescribed Carafate to help coat your stomach and alleviate discomfort.  Continue Nexium and add Pepcid as we discussed.  Follow-up with emergency department if symptoms persist or worsen.

## 2023-08-27 NOTE — ED Triage Notes (Signed)
Pt reports epi gastric burning that started last night with a HX of acid reflux. Pt also reports feeling dizzy this morning. Pt has been taking OTC for reflux.

## 2023-08-27 NOTE — ED Provider Notes (Signed)
[facility]    CSN: [id number] Arrival date & time: 08/27/23  1028      History   Chief Complaint Chief Complaint  Patient presents with   Abdominal Pain   Dizziness    HPI Wanda Carlson is a 52 y.o. female.   Patient presents with abdominal pain that started last night.  Patient reports that it started after she ate a steak and cheese sub.  States that she has a long history of GERD and takes Nexium for this.  She did take protonix that was family member's prescription with no improvement as well. Pain feels like a pressure in the center of her chest.  She also has had some dizziness but reports that she has had nausea and vomiting throughout the night as well.  Denies any blood in her vomit.  Having normal bowel movements with no blood in stool.  Denies fever.  Denies headache, blurred vision, shortness of breath.  Patient has a pertinent medical history of PACs where she is followed by cardiology.  Reports that she previously took metoprolol but no longer takes this anymore.   Abdominal Pain Dizziness   Past Medical History:  Diagnosis Date   Anxiety    PAC (premature atrial contraction)     Patient Active Problem List   Diagnosis Date Noted   Palpitations 11/27/2015    Past Surgical History:  Procedure Laterality Date   No previous surgery      OB History   No obstetric history on file.      Home Medications    Prior to Admission medications   Medication Sig Start Date End Date Taking? Authorizing Provider  Famotidine (PEPCID PO) Take by mouth as needed.   Yes [provider]  fluticasone (FLONASE) 50 MCG/ACT nasal spray Place 2 sprays into both nostrils daily. 06/13/22  Yes Hawks, Christy A, FNP  sucralfate (CARAFATE) 1 g tablet Take 1 tablet (1 g total) by mouth 2 (two) times daily. 08/27/23  Yes Gustavus Bryant, FNP    Family History Family History  Problem Relation Age of Onset   Heart attack Maternal Grandfather     Hyperlipidemia Mother    Asthma Father     Social History Social History   Tobacco Use   Smoking status: Never   Smokeless tobacco: Never  Substance Use Topics   Alcohol use: Yes    Alcohol/week: 0.0 standard drinks of alcohol    Comment: Rare     Allergies   Neosporin [neomycin-bacitracin zn-polymyx], Azithromycin, and Tetrahydrozoline   Review of Systems Review of Systems Per HPI  Physical Exam Triage Vital Signs ED Triage Vitals [08/27/23 1039]  Encounter Vitals Group     BP 113/77     Systolic BP Percentile      Diastolic BP Percentile      Pulse Rate 82     Resp 18     Temp 98.8 F (37.1 C)     Temp src      SpO2 94 %     Weight      Height      Head Circumference      Peak Flow      Pain Score      Pain Loc      Pain Education      Exclude from Growth Chart    No data found.  Updated Vital Signs BP 113/77   Pulse 82   Temp 98.8 F (37.1 C)   Resp 18  SpO2 94%   Visual Acuity Right Eye Distance:   Left Eye Distance:   Bilateral Distance:    Right Eye Near:   Left Eye Near:    Bilateral Near:     Physical Exam Constitutional:      General: She is not in acute distress.    Appearance: Normal appearance. She is not toxic-appearing or diaphoretic.  HENT:     Head: Normocephalic and atraumatic.     Mouth/Throat:     Mouth: Mucous membranes are moist.     Pharynx: No posterior oropharyngeal erythema.  Eyes:     Extraocular Movements: Extraocular movements intact.     Conjunctiva/sclera: Conjunctivae normal.  Cardiovascular:     Rate and Rhythm: Normal rate and regular rhythm.     Pulses: Normal pulses.     Heart sounds: Normal heart sounds.  Pulmonary:     Effort: Pulmonary effort is normal. No respiratory distress.     Breath sounds: Normal breath sounds.  Abdominal:     General: Bowel sounds are normal. There is no distension.     Palpations: Abdomen is soft.     Tenderness: There is no abdominal tenderness.  Neurological:      General: No focal deficit present.     Mental Status: She is alert and oriented to person, place, and time. Mental status is at baseline.     Cranial Nerves: Cranial nerves 2-12 are intact.     Sensory: Sensation is intact.     Motor: Motor function is intact.     Coordination: Coordination is intact.     Gait: Gait is intact.  Psychiatric:        Mood and Affect: Mood normal.        Behavior: Behavior normal.        Thought Content: Thought content normal.        Judgment: Judgment normal.      UC Treatments / Results  Labs (all labs ordered are listed, but only abnormal results are displayed) Labs Reviewed - No data to display  EKG   Radiology No results found.  Procedures Procedures (including critical care time)  Medications Ordered in UC Medications  aluminum-magnesium hydroxide 200-200 MG/5ML suspension 30 mL (30 mLs Oral Given 08/27/23 1059)  lidocaine (XYLOCAINE) 2 % viscous mouth solution 15 mL (15 mLs Mouth/Throat Given 08/27/23 1059)    Initial Impression / Assessment and Plan / UC Course  I have reviewed the triage vital signs and the nursing notes.  Pertinent labs & imaging results that were available during my care of the patient were reviewed by me and considered in my medical decision making (see chart for details).     EKG completed to ensure no cardiac etiology which was unremarkable when compared to previous EKGs.  Suspect that patient is having a flareup of GERD.  GI cocktail administered in urgent care with improvement in symptoms.  Therefore, do not think that emergent evaluation is necessary at this most likely related to GERD.  Patient to continue Nexium.  Advised patient to take Pepcid as well and she reports that she has this at home.  Will prescribe Carafate to help coat the stomach and help alleviate discomfort.  Suspect patient's dizziness is due to possible current pain versus nausea and vomiting.  No obvious signs of acute abdomen or dehydration  on exam.  Although, patient was given strict ER precautions if symptoms persist or worsen.  Patient verbalized understanding and was agreeable with plan. Final  Clinical Impressions(s) / UC Diagnoses   Final diagnoses:  Abdominal pain, epigastric     Discharge Instructions      I have prescribed Carafate to help coat your stomach and alleviate discomfort.  Continue Nexium and add Pepcid as we discussed.  Follow-up with emergency department if symptoms persist or worsen.     ED Prescriptions     Medication Sig Dispense Auth. Provider   sucralfate (CARAFATE) 1 g tablet Take 1 tablet (1 g total) by mouth 2 (two) times daily. 30 tablet [place], Acie Fredrickson, [place]      PDMP not reviewed this encounter.   Gustavus Bryant, [place] 08/27/23 1144

## 2023-09-14 DIAGNOSIS — E785 Hyperlipidemia, unspecified: Secondary | ICD-10-CM | POA: Diagnosis not present

## 2024-05-01 ENCOUNTER — Ambulatory Visit: Payer: BC Managed Care – PPO | Admitting: Family Medicine

## 2024-06-12 DIAGNOSIS — E7800 Pure hypercholesterolemia, unspecified: Secondary | ICD-10-CM | POA: Diagnosis not present

## 2024-06-14 DIAGNOSIS — Z713 Dietary counseling and surveillance: Secondary | ICD-10-CM | POA: Diagnosis not present

## 2024-07-04 DIAGNOSIS — A499 Bacterial infection, unspecified: Secondary | ICD-10-CM | POA: Diagnosis not present

## 2024-07-25 DIAGNOSIS — Z713 Dietary counseling and surveillance: Secondary | ICD-10-CM | POA: Diagnosis not present

## 2024-08-03 ENCOUNTER — Ambulatory Visit: Admission: EM | Admit: 2024-08-03 | Discharge: 2024-08-03 | Disposition: A | Discharge status: Home / Self Care

## 2024-08-03 DIAGNOSIS — L02416 Cutaneous abscess of left lower limb: Secondary | ICD-10-CM | POA: Diagnosis not present

## 2024-08-03 MED ORDER — SULFAMETHOXAZOLE-TRIMETHOPRIM 800-160 MG PO TABS: 1.0000 | ORAL_TABLET | Freq: Two times a day (BID) | ORAL | 0 refills | Status: AC

## 2024-08-03 NOTE — Discharge Instructions (Addendum)
 You have a skin infection on your left thigh that is being treated with antibiotics. Take the antibiotic exactly as prescribed until the full course is complete, even if the area improves before then. Apply moist warm compresses to the area several times a day to help with healing and comfort. Do not squeeze, mash, or pick at the lesion. If it starts to drain on its own, you may gently assist the drainage but stop immediately when you see blood. Wash your hands well after touching the area and keep it clean and covered with a dry bandage. Follow up in the clinic or with your primary care provider in two days if the area has not improved, has not started to drain, or becomes more painful.   Go to the ED immediately if you develop fever, chills, rapidly spreading redness, increased swelling, severe pain, or drainage with a foul odor, or if you notice swelling or redness spreading beyond the original area.

## 2024-08-03 NOTE — ED Triage Notes (Signed)
 I have a skin abscess on the inside of my left leg, it has been their about a week or so, started as an ingrown hair?, it has drained some but just not improving. No fever.

## 2024-08-03 NOTE — ED Provider Notes (Signed)
 [facility]    CSN: [id number] Arrival date & time: 08/03/24  0806      History   Chief Complaint Chief Complaint  Patient presents with   Skin Problem    HPI Wanda Carlson is a 53 y.o. female.   Discussed the use of AI scribe software for clinical note transcription with the patient, who gave verbal consent to proceed.   The patient presents with a sore on the left thigh that has been draining intermittently but has not improved. The lesion began as a whitehead last week, which the patient manually expressed, and it has persisted since. She reports occasional drainage of a small amount of pus and blood when applying a hot compress and manually manipulating the area. Yesterday's drainage was primarily pus, while today it is mixed pus and blood. The area was previously more erythematous, which the patient believes was due to irritation from adhesive bandages; the redness has decreased since discontinuing their use. The patient denies fever or chills. The lesion is tender to palpation.  The following portions of the patient's history were reviewed and updated as appropriate: allergies, current medications, past family history, past medical history, past social history, past surgical history, and problem list.       Past Medical History:  Diagnosis Date   Anxiety    PAC (premature atrial contraction)     Patient Active Problem List   Diagnosis Date Noted   Palpitations 11/27/2015    Past Surgical History:  Procedure Laterality Date   No previous surgery      OB History   No obstetric history on file.      Home Medications    Prior to Admission medications   Medication Sig Start Date End Date Taking? Authorizing Provider  sulfamethoxazole -trimethoprim  (BACTRIM  DS) 800-160 MG tablet Take 1 tablet by mouth 2 (two) times daily for 7 days. 08/03/24 08/10/24 Yes Jomari Bartnik, FNP  Famotidine (PEPCID PO) Take by mouth as needed.    [provider]  famotidine (PEPCID) 10 MG tablet Take 10 mg by mouth daily.    [provider]  fluticasone  (FLONASE ) 50 MCG/ACT nasal spray Place 2 sprays into both nostrils daily. 06/13/22   Lavell Lye A, FNP  sucralfate  (CARAFATE ) 1 g tablet Take 1 tablet (1 g total) by mouth 2 (two) times daily. 08/27/23   Hazen Darryle BRAVO, FNP    Family History Family History  Problem Relation Age of Onset   Heart attack Maternal Grandfather    Hyperlipidemia Mother    Asthma Father     Social History Social History   Tobacco Use   Smoking status: Never   Smokeless tobacco: Never  Vaping Use   Vaping status: Never Used  Substance Use Topics   Alcohol use: Yes    Alcohol/week: 0.0 standard drinks of alcohol    Comment: Rare   Drug use: Never     Allergies   Neosporin [neomycin-bacitracin zn-polymyx], Azithromycin, Bacitra-neomycin-polymyxin-hc, and Tetrahydrozoline   Review of Systems Review of Systems  Constitutional:  Negative for chills and fever.  Gastrointestinal:  Negative for nausea.  Musculoskeletal:  Negative for myalgias.  Skin:  Positive for wound.  All other systems reviewed and are negative.    Physical Exam Triage Vital Signs ED Triage Vitals  Encounter Vitals Group     BP 08/03/24 0836 120/72     Girls Systolic BP Percentile --      Girls Diastolic BP Percentile --  Boys Systolic BP Percentile --      Boys Diastolic BP Percentile --      Pulse Rate 08/03/24 0836 68     Resp 08/03/24 0836 18     Temp 08/03/24 0836 98.1 F (36.7 C)     Temp Source 08/03/24 0836 Oral     SpO2 08/03/24 0836 97 %     Weight 08/03/24 0831 155 lb (70.3 kg)     Height 08/03/24 0831 5' 5 (1.651 m)     Head Circumference --      Peak Flow --      Pain Score 08/03/24 0831 3     Pain Loc --      Pain Education --      Exclude from Growth Chart --    No data found.  Updated Vital Signs BP 120/72 (BP Location: Right Arm)   Pulse 68   Temp 98.1 F (36.7 C) (Oral)   Resp  18   Ht 5' 5 (1.651 m)   Wt 155 lb (70.3 kg)   SpO2 97%   BMI 25.79 kg/m   Visual Acuity Right Eye Distance:   Left Eye Distance:   Bilateral Distance:    Right Eye Near:   Left Eye Near:    Bilateral Near:     Physical Exam Vitals reviewed.  Constitutional:      General: She is awake. She is not in acute distress.    Appearance: Normal appearance. She is well-developed. She is not ill-appearing, toxic-appearing or diaphoretic.  HENT:     Head: Normocephalic.     Right Ear: Hearing normal.     Left Ear: Hearing normal.     Nose: Nose normal.     Mouth/Throat:     Mouth: Mucous membranes are moist.  Eyes:     General: Vision grossly intact.     Conjunctiva/sclera: Conjunctivae normal.  Cardiovascular:     Rate and Rhythm: Normal rate and regular rhythm.     Heart sounds: Normal heart sounds.  Pulmonary:     Effort: Pulmonary effort is normal.     Breath sounds: Normal breath sounds and air entry.  Musculoskeletal:        General: Normal range of motion.     Cervical back: Full passive range of motion without pain, normal range of motion and neck supple.  Skin:    General: Skin is warm and dry.     Findings: Abscess present.     Comments: Abscess present on the inner aspect of the left upper thigh with minimal surrounding erythema. The area is firm on palpation, with no underlying fluctuance (see picture below)   Neurological:     General: No focal deficit present.     Mental Status: She is alert and oriented to person, place, and time.  Psychiatric:        Speech: Speech normal.        Behavior: Behavior is cooperative.      UC Treatments / Results  Labs (all labs ordered are listed, but only abnormal results are displayed) Labs Reviewed - No data to display  EKG   Radiology No results found.  Procedures Procedures (including critical care time)  Medications Ordered in UC Medications - No data to display  Initial Impression / Assessment and Plan  / UC Course  I have reviewed the triage vital signs and the nursing notes.  Pertinent labs & imaging results that were available during my care of the patient  were reviewed by me and considered in my medical decision making (see chart for details).     Patient presents with a painful lesion on the left thigh that began as a whitehead last weekend and has intermittently drained pus and blood with hot compresses and manual manipulation. No fever or chills reported. Examination shows tenderness without palpable fluctuance, indicating no drainable abscess at this time. Bactrim  prescribed twice daily for seven days. Advised to apply moist warm compresses, avoid manipulating the lesion, and if spontaneous drainage occurs, to stop expressing fluid once blood appears. Patient instructed to return in 48 hours if the area becomes fluctuant for possible incision and drainage or sooner if symptoms worsen, and to follow up with the primary care provider or clinic in two days if there is no improvement. No follow-up needed if the condition resolves.  Today's evaluation has revealed no signs of a dangerous process. Discussed diagnosis with patient and/or guardian. Patient and/or guardian aware of their diagnosis, possible red flag symptoms to watch out for and need for close follow up. Patient and/or guardian understands verbal and written discharge instructions. Patient and/or guardian comfortable with plan and disposition.  Patient and/or guardian has a clear mental status at this time, good insight into illness (after discussion and teaching) and has clear judgment to make decisions regarding their care  Documentation was completed with the aid of voice recognition software. Transcription may contain typographical errors.   Final Clinical Impressions(s) / UC Diagnoses   Final diagnoses:  Abscess of left thigh     Discharge Instructions      You have a skin infection on your left thigh that is being  treated with antibiotics. Take the antibiotic exactly as prescribed until the full course is complete, even if the area improves before then. Apply moist warm compresses to the area several times a day to help with healing and comfort. Do not squeeze, mash, or pick at the lesion. If it starts to drain on its own, you may gently assist the drainage but stop immediately when you see blood. Wash your hands well after touching the area and keep it clean and covered with a dry bandage. Follow up in the clinic or with your primary care provider in two days if the area has not improved, has not started to drain, or becomes more painful.   Go to the ED immediately if you develop fever, chills, rapidly spreading redness, increased swelling, severe pain, or drainage with a foul odor, or if you notice swelling or redness spreading beyond the original area.      ED Prescriptions     Medication Sig Dispense Auth. Provider   sulfamethoxazole -trimethoprim  (BACTRIM  DS) 800-160 MG tablet Take 1 tablet by mouth 2 (two) times daily for 7 days. 14 tablet Iola Lukes, FNP      PDMP not reviewed this encounter.   Iola [place], [place] 08/03/24 [phone]

## 2024-08-28 DIAGNOSIS — Z01419 Encounter for gynecological examination (general) (routine) without abnormal findings: Secondary | ICD-10-CM | POA: Diagnosis not present

## 2024-08-28 DIAGNOSIS — Z1231 Encounter for screening mammogram for malignant neoplasm of breast: Secondary | ICD-10-CM | POA: Diagnosis not present

## 2024-08-28 DIAGNOSIS — Z13820 Encounter for screening for osteoporosis: Secondary | ICD-10-CM | POA: Diagnosis not present

## 2024-08-28 DIAGNOSIS — Z6825 Body mass index (BMI) 25.0-25.9, adult: Secondary | ICD-10-CM | POA: Diagnosis not present

## 2024-09-05 DIAGNOSIS — J00 Acute nasopharyngitis [common cold]: Secondary | ICD-10-CM | POA: Diagnosis not present

## 2024-09-10 ENCOUNTER — Ambulatory Visit (INDEPENDENT_AMBULATORY_CARE_PROVIDER_SITE_OTHER)

## 2024-09-10 ENCOUNTER — Ambulatory Visit: Admission: EM | Admit: 2024-09-10 | Discharge: 2024-09-10 | Disposition: A | Discharge status: Home / Self Care

## 2024-09-10 DIAGNOSIS — R051 Acute cough: Secondary | ICD-10-CM | POA: Diagnosis not present

## 2024-09-10 DIAGNOSIS — R059 Cough, unspecified: Secondary | ICD-10-CM | POA: Diagnosis not present

## 2024-09-10 DIAGNOSIS — J206 Acute bronchitis due to rhinovirus: Secondary | ICD-10-CM | POA: Diagnosis not present

## 2024-09-10 MED ORDER — AMOXICILLIN-POT CLAVULANATE 875-125 MG PO TABS: 1.0000 | ORAL_TABLET | Freq: Two times a day (BID) | ORAL | 0 refills | Status: AC

## 2024-09-10 MED ORDER — HYDROCOD POLI-CHLORPHE POLI ER 10-8 MG/5ML PO SUER: 5.0000 mL | Freq: Every evening | ORAL | 0 refills | Status: AC | PRN

## 2024-09-10 MED ORDER — PROMETHAZINE-DM 6.25-15 MG/5ML PO SYRP: 10.0000 mL | ORAL_SOLUTION | Freq: Four times a day (QID) | ORAL | 0 refills | Status: AC | PRN

## 2024-09-10 MED ORDER — PREDNISONE 10 MG (21) PO TBPK: ORAL_TABLET | Freq: Every day | ORAL | 0 refills | Status: AC

## 2024-09-10 MED ORDER — MUCINEX DM MAXIMUM STRENGTH 60-1200 MG PO TB12: 1.0000 | ORAL_TABLET | Freq: Two times a day (BID) | ORAL | 0 refills | Status: AC

## 2024-09-10 MED ORDER — IPRATROPIUM-ALBUTEROL 0.5-2.5 (3) MG/3ML IN SOLN
3.0000 mL | Freq: Once | RESPIRATORY_TRACT | Status: AC
  Administered 2024-09-10: 3 mL via RESPIRATORY_TRACT

## 2024-09-10 NOTE — ED Provider Notes (Signed)
 [facility]    CSN: [id number] Arrival date & time: 09/10/24  0805      History   Chief Complaint Chief Complaint  Patient presents with   Cough   Nasal Congestion    HPI Wanda Carlson is a 53 y.o. female.   Discussed the use of AI scribe software for clinical note transcription with the patient, who gave verbal consent to proceed.   The patient presents with a persistent cough that started 9 days ago. Initially, the cough was mild but has since worsened. The patient had a [facility] visit about 6 days ago due to initial symptoms, which included a low-grade fever, congestion, myalgias and sore throat. These symptoms have mostly resolved, but the cough has persisted and intensified.  The patient describes the cough as productive and reports hearing rattling in her chest. She also experience some wheezing and shortness of breath, describing their chest as feeling full of tight and heavy. The patient has been using Mucinex  consistently to manage symptoms. Nasal drainage is present but not bothersome. The patient denies any history of lung diseases such as COPD or asthma. The patient does not smoke or vape.   The following sections of the patient's history were reviewed and updated as appropriate: allergies, current medications, past family history, past medical history, past social history, past surgical history, and problem list.      Past Medical History:  Diagnosis Date   Anxiety    PAC (premature atrial contraction)     Patient Active Problem List   Diagnosis Date Noted   Palpitations 11/27/2015    Past Surgical History:  Procedure Laterality Date   No previous surgery      OB History   No obstetric history on file.      Home Medications    Prior to Admission medications   Medication Sig Start Date End Date Taking? Authorizing Provider  acetaminophen (TYLENOL) 650 MG CR tablet Take 650 mg by mouth every 8 (eight) hours as needed for pain.   Yes  [provider]  amoxicillin -clavulanate (AUGMENTIN ) 875-125 MG tablet Take 1 tablet by mouth 2 (two) times daily after a meal. 09/10/24  Yes Iola Lukes, FNP  benzonatate (TESSALON) 200 MG capsule Take 200 mg by mouth 3 (three) times daily. 09/05/24  Yes [provider]  chlorpheniramine-HYDROcodone (TUSSIONEX) 10-8 MG/5ML Take 5 mLs by mouth at bedtime as needed (for cough and to help sleep). 09/10/24  Yes Iola Lukes, FNP  Dextromethorphan-guaiFENesin  (MUCINEX  DM MAXIMUM STRENGTH) 60-1200 MG TB12 Take 1 tablet by mouth 2 (two) times daily. 09/10/24  Yes Sylvie Mifsud, Lukes, FNP  predniSONE  (STERAPRED UNI-PAK 21 TAB) 10 MG (21) TBPK tablet Take by mouth daily. Take 6 tabs by mouth daily  for 2 days, then 5 tabs for 2 days, then 4 tabs for 2 days, then 3 tabs for 2 days, 2 tabs for 2 days, then 1 tab by mouth daily for 2 days 09/10/24  Yes Iola Lukes, FNP  promethazine -dextromethorphan (PROMETHAZINE -DM) 6.25-15 MG/5ML syrup Take 10 mLs by mouth every 6 (six) hours as needed for cough. 09/10/24  Yes Shyne Resch, FNP  Esomeprazole Magnesium  (NEXIUM PO) NexIUM    [provider]  Famotidine (PEPCID PO) Take by mouth as needed.    [provider]  famotidine (PEPCID) 10 MG tablet Take 10 mg by mouth daily.    [provider]  fluticasone  (FLONASE ) 50 MCG/ACT nasal spray Place 2 sprays into both nostrils daily. 06/13/22   Lavell Lye  A, FNP  sucralfate  (CARAFATE ) 1 g tablet Take 1 tablet (1 g total) by mouth 2 (two) times daily. 08/27/23   Hazen Darryle BRAVO, FNP    Family History Family History  Problem Relation Age of Onset   Heart attack Maternal Grandfather    Hyperlipidemia Mother    Asthma Father     Social History Social History   Tobacco Use   Smoking status: Never   Smokeless tobacco: Never  Vaping Use   Vaping status: Never Used  Substance Use Topics   Alcohol use: Yes    Alcohol/week: 0.0 standard drinks of alcohol     Comment: Rare   Drug use: Never     Allergies   Neosporin [neomycin-bacitracin zn-polymyx], Azithromycin, Bacitra-neomycin-polymyxin-hc, and Tetrahydrozoline   Review of Systems Review of Systems  Constitutional:  Negative for fever.  HENT:  Positive for rhinorrhea. Negative for congestion, postnasal drip, sneezing and sore throat.   Respiratory:  Positive for cough (productive), chest tightness, wheezing and stridor.        Chest congestion, feel rattling in the chest    Gastrointestinal:  Negative for diarrhea, nausea and vomiting.  Musculoskeletal:  Negative for myalgias.  Neurological:  Negative for headaches.  All other systems reviewed and are negative.    Physical Exam Triage Vital Signs ED Triage Vitals  Encounter Vitals Group     BP 09/10/24 0836 120/74     Girls Systolic BP Percentile --      Girls Diastolic BP Percentile --      Boys Systolic BP Percentile --      Boys Diastolic BP Percentile --      Pulse Rate 09/10/24 0836 74     Resp 09/10/24 0836 20     Temp 09/10/24 0836 98.4 F (36.9 C)     Temp Source 09/10/24 0836 Oral     SpO2 09/10/24 0836 96 %     Weight 09/10/24 0833 155 lb (70.3 kg)     Height 09/10/24 0833 5' 5 (1.651 m)     Head Circumference --      Peak Flow --      Pain Score 09/10/24 0831 0     Pain Loc --      Pain Education --      Exclude from Growth Chart --    No data found.  Updated Vital Signs BP 120/74 (BP Location: Right Arm)   Pulse 85   Temp 98.4 F (36.9 C) (Oral)   Resp 20   Ht 5' 5 (1.651 m)   Wt 155 lb (70.3 kg)   SpO2 99%   BMI 25.79 kg/m   Visual Acuity Right Eye Distance:   Left Eye Distance:   Bilateral Distance:    Right Eye Near:   Left Eye Near:    Bilateral Near:     Physical Exam Vitals reviewed.  Constitutional:      General: She is awake. She is not in acute distress.    Appearance: Normal appearance. She is well-developed and well-groomed. She is not ill-appearing, toxic-appearing or  diaphoretic.  HENT:     Head: Normocephalic.     Right Ear: Tympanic membrane, ear canal and external ear normal. No drainage, swelling or tenderness. No middle ear effusion. Tympanic membrane is not erythematous.     Left Ear: Tympanic membrane, ear canal and external ear normal. No drainage, swelling or tenderness.  No middle ear effusion. Tympanic membrane is not erythematous.     Nose: No  congestion or rhinorrhea.     Mouth/Throat:     Lips: Pink.     Mouth: Mucous membranes are moist.     Pharynx: Oropharynx is clear. Uvula midline. No pharyngeal swelling, oropharyngeal exudate, posterior oropharyngeal erythema or uvula swelling.     Tonsils: No tonsillar exudate or tonsillar abscesses.  Eyes:     General: Vision grossly intact.     Conjunctiva/sclera: Conjunctivae normal.  Cardiovascular:     Rate and Rhythm: Normal rate.     Heart sounds: Normal heart sounds.  Pulmonary:     Effort: Pulmonary effort is normal. No tachypnea or respiratory distress.     Breath sounds: Normal air entry. No decreased air movement. Examination of the right-middle field reveals rhonchi. Examination of the left-middle field reveals rhonchi. Examination of the right-lower field reveals rhonchi. Examination of the left-lower field reveals rhonchi. Rhonchi present. No decreased breath sounds.     Comments: Respirations even and unlabored  Musculoskeletal:        General: Normal range of motion.     Cervical back: Normal range of motion and neck supple.  Lymphadenopathy:     Cervical: No cervical adenopathy.  Skin:    General: Skin is warm and dry.  Neurological:     General: No focal deficit present.     Mental Status: She is alert and oriented to person, place, and time.  Psychiatric:        Behavior: Behavior is cooperative.      UC Treatments / Results  Labs (all labs ordered are listed, but only abnormal results are displayed) Labs Reviewed - No data to display  EKG   Radiology DG Chest  2 View Result Date: 09/10/2024 CLINICAL DATA:  Worsening cough 9 days. EXAM: CHEST - 2 VIEW COMPARISON:  None Available. FINDINGS: Lungs are adequately inflated and otherwise clear. Cardiomediastinal silhouette is normal. Bones and soft tissues are unremarkable. IMPRESSION: No active cardiopulmonary disease. Electronically Signed   By: Toribio Agreste M.D.   On: 09/10/2024 09:30    Procedures Procedures (including critical care time)  Medications Ordered in UC Medications  ipratropium-albuterol  (DUONEB) 0.5-2.5 (3) MG/3ML nebulizer solution 3 mL (3 mLs Nebulization Given 09/10/24 0914)    Initial Impression / Assessment and Plan / UC Course  I have reviewed the triage vital signs and the nursing notes.  Pertinent labs & imaging results that were available during my care of the patient were reviewed by me and considered in my medical decision making (see chart for details).    The patient presents with cough, wheezing, and shortness of breath. Chest X-ray showed no evidence of pneumonia or other acute abnormalities. In clinic, symptoms improved following a Duoneb treatment. The patient is afebrile, nontoxic, with even and unlabored respirations and no acute distress. Clinical impression is acute bronchitis. Treatment initiated with oral antibiotics, a short course of steroids, Mucinex  DM twice daily, promethazine  DM BID, and Tussionex at bedtime as needed. Supportive measures including rest, hydration, and over-the-counter symptom relief were also advised. Patient instructed to follow up with PCP if symptoms do not improve within several days and to seek emergency care for worsening shortness of breath, chest pain, persistent high fever, or other concerning symptoms.  Today's evaluation has revealed no signs of a dangerous process. Discussed diagnosis with patient and/or guardian. Patient and/or guardian aware of their diagnosis, possible red flag symptoms to watch out for and need for close follow  up. Patient and/or guardian understands verbal and written discharge instructions.  Patient and/or guardian comfortable with plan and disposition.  Patient and/or guardian has a clear mental status at this time, good insight into illness (after discussion and teaching) and has clear judgment to make decisions regarding their care  Documentation was completed with the aid of voice recognition software. Transcription may contain typographical errors.  Final Clinical Impressions(s) / UC Diagnoses   Final diagnoses:  Acute cough  Acute bronchitis due to Rhinovirus     Discharge Instructions      You have been diagnosed with acute bronchitis, which is a sudden inflammation of the large airways in your lungs. This inflammation causes the airways to narrow and produce more mucus, making it harder to breathe and leading to coughing. Acute bronchitis is most often caused by the same viruses that cause the common cold.  Take the medications that were prescribed to you as directed. If you have a fever, headache, or body aches, you can also take Tylenol or ibuprofen to help you feel more comfortable. Be sure to drink plenty of fluids to stay hydrated--aim for enough to keep your urine a pale yellow color. This will also help to thin mucus and make it easier to clear from your body. The prescribed cough medicine should help loosen mucus, relieve congestion, and ease your cough. Taking two teaspoons of honey at bedtime may also help reduce nighttime coughing. Avoid using any nicotine or tobacco products, as they can worsen your symptoms and delay healing.   Using a cool mist humidifier at home to keep humidity levels above 50% can be helpful. You can also inhale steam for 10 to 15 minutes, 3 to 4 times a day. This can be done by sitting in the bathroom with a hot shower running, or by using over-the-counter vapor shower tablets to help with nasal congestion. Try to avoid cool or dry air as much as possible. Be  sure to get enough rest every night to support your recovery. Don't forget to replace your toothbrush once you start feeling better.   It's normal for a cough to linger for several weeks after a respiratory illness, even after other symptoms have resolved. This happens because the airways remain irritated and take time to fully heal. As long as the cough gradually improves and there are no new concerning symptoms, this is part of the normal recovery process.  Seek emergency care right away if you cough up blood, feel chest pain, have severe shortness of breath, faint or feel like you might faint, develop a severe headache, or experience worsening fever or chills.       ED Prescriptions     Medication Sig Dispense Auth. Provider   amoxicillin -clavulanate (AUGMENTIN ) 875-125 MG tablet Take 1 tablet by mouth 2 (two) times daily after a meal. 14 tablet Jalaya Sarver, [place], FNP   Dextromethorphan-guaiFENesin  (MUCINEX  DM MAXIMUM STRENGTH) 60-1200 MG TB12 Take 1 tablet by mouth 2 (two) times daily. 20 tablet Delanna Blacketer, FNP   chlorpheniramine-HYDROcodone (TUSSIONEX) 10-8 MG/5ML Take 5 mLs by mouth at bedtime as needed (for cough and to help sleep). 70 mL Iola Lukes, FNP   promethazine -dextromethorphan (PROMETHAZINE -DM) 6.25-15 MG/5ML syrup Take 10 mLs by mouth every 6 (six) hours as needed for cough. 118 mL [place]rette Mall, [place], FNP   predniSONE  (STERAPRED UNI-PAK 21 TAB) 10 MG (21) TBPK tablet Take by mouth daily. Take 6 tabs by mouth daily  for 2 days, then 5 tabs for 2 days, then 4 tabs for 2 days, then 3 tabs for 2 days, 2  tabs for 2 days, then 1 tab by mouth daily for 2 days 42 tablet Iola Lukes, FNP      I have reviewed the PDMP during this encounter.   Iola Lukes, [place] 09/10/24 1005

## 2024-09-10 NOTE — ED Triage Notes (Signed)
 Patient reports Cough starting 9 days ago, did a Virtual visit and did all the recommendations but the Cough is worrying me (Deep/Productive) and when lying down pretty bad. No fever.

## 2024-09-10 NOTE — Discharge Instructions (Signed)
 You have been diagnosed with acute bronchitis, which is a sudden inflammation of the large airways in your lungs. This inflammation causes the airways to narrow and produce more mucus, making it harder to breathe and leading to coughing. Acute bronchitis is most often caused by the same viruses that cause the common cold.  Take the medications that were prescribed to you as directed. If you have a fever, headache, or body aches, you can also take Tylenol or ibuprofen to help you feel more comfortable. Be sure to drink plenty of fluids to stay hydrated--aim for enough to keep your urine a pale yellow color. This will also help to thin mucus and make it easier to clear from your body. The prescribed cough medicine should help loosen mucus, relieve congestion, and ease your cough. Taking two teaspoons of honey at bedtime may also help reduce nighttime coughing. Avoid using any nicotine or tobacco products, as they can worsen your symptoms and delay healing.   Using a cool mist humidifier at home to keep humidity levels above 50% can be helpful. You can also inhale steam for 10 to 15 minutes, 3 to 4 times a day. This can be done by sitting in the bathroom with a hot shower running, or by using over-the-counter vapor shower tablets to help with nasal congestion. Try to avoid cool or dry air as much as possible. Be sure to get enough rest every night to support your recovery. Don't forget to replace your toothbrush once you start feeling better.   It's normal for a cough to linger for several weeks after a respiratory illness, even after other symptoms have resolved. This happens because the airways remain irritated and take time to fully heal. As long as the cough gradually improves and there are no new concerning symptoms, this is part of the normal recovery process.  Seek emergency care right away if you cough up blood, feel chest pain, have severe shortness of breath, faint or feel like you might faint,  develop a severe headache, or experience worsening fever or chills.

## 2024-10-24 DIAGNOSIS — F419 Anxiety disorder, unspecified: Secondary | ICD-10-CM | POA: Diagnosis not present

## 2024-10-24 DIAGNOSIS — R41840 Attention and concentration deficit: Secondary | ICD-10-CM | POA: Diagnosis not present

## 2024-10-24 DIAGNOSIS — Z820 Family history of epilepsy and other diseases of the nervous system: Secondary | ICD-10-CM | POA: Diagnosis not present

## 2024-12-05 DIAGNOSIS — F419 Anxiety disorder, unspecified: Secondary | ICD-10-CM | POA: Diagnosis not present

## 2024-12-05 DIAGNOSIS — L723 Sebaceous cyst: Secondary | ICD-10-CM | POA: Diagnosis not present
